# Patient Record
Sex: Male | Born: 2005 | Race: White | Hispanic: No | Marital: Single | Smoking: Never smoker
Health system: Southern US, Community
[De-identification: ages and names within clinical notes are randomized; demographics above are authoritative.]

## PROBLEM LIST (undated history)

## (undated) DIAGNOSIS — Q761 Klippel-Feil syndrome: Secondary | ICD-10-CM

## (undated) HISTORY — DX: Klippel-Feil syndrome: Q76.1

---
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
## 2023-12-24 ENCOUNTER — Ambulatory Visit (INDEPENDENT_AMBULATORY_CARE_PROVIDER_SITE_OTHER): Admitting: Internal Medicine

## 2023-12-24 ENCOUNTER — Encounter: Payer: Self-pay | Admitting: Internal Medicine

## 2023-12-24 VITALS — BP 112/60 | Ht 67.5 in | Wt 108.8 lb

## 2023-12-24 DIAGNOSIS — Q761 Klippel-Feil syndrome: Secondary | ICD-10-CM | POA: Insufficient documentation

## 2023-12-24 DIAGNOSIS — R636 Underweight: Secondary | ICD-10-CM | POA: Diagnosis not present

## 2023-12-24 DIAGNOSIS — Z681 Body mass index (BMI) 19 or less, adult: Secondary | ICD-10-CM | POA: Diagnosis not present

## 2023-12-24 NOTE — Progress Notes (Signed)
 Subjective:    Patient ID: Alejandro Stein, male    DOB: 2005-09-16, 18 y.o.   MRN: [id number]  HPI  Pt presents to the clinic today to establish care and for management of the conditions listed below.   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 He does follow with Dr. Jarold at [facility] and needs a new referral placed.   Review of Systems   No past medical history on file.  No current outpatient medications on file.   No current facility-administered medications for this visit.    No Known Allergies  No family history on file.  Social History   Socioeconomic History   Marital status: Single    Spouse name: Not on file   Number of children: Not on file   Years of education: Not on file   Highest education level: Not on file  Occupational History   Not on file  Tobacco Use   Smoking status: Never   Smokeless tobacco: Not on file  Substance and Sexual Activity   Alcohol use: No   Drug use: Not on file   Sexual activity: Not on file  Other Topics Concern   Not on file  Social History Narrative   Not on file   Social Drivers of Health   Financial Resource Strain: Not on file  Food Insecurity: Not on file  Transportation Needs: Not on file  Physical Activity: Not on file  Stress: Not on file  Social Connections: Not on file  Intimate Partner Violence: Not on file     Constitutional: Denies fever, malaise, fatigue, headache or abrupt weight changes.  HEENT: Denies eye pain, eye redness, ear pain, ringing in the ears, wax buildup, runny nose, nasal congestion, bloody nose, or sore throat. Respiratory: Denies difficulty breathing, shortness of breath, cough or sputum production.   Cardiovascular: Denies chest pain, chest tightness, palpitations or swelling in the hands or feet.  Gastrointestinal: Denies abdominal pain, bloating, constipation, diarrhea or blood in the stool.  GU: Denies urgency,  frequency, pain with urination, burning sensation, blood in urine, odor or discharge. Musculoskeletal: Pt reports nocturnal leg pain. Denies decrease in range of motion, difficulty with gait, muscle pain or joint pain and swelling.  Skin: Denies redness, rashes, lesions or ulcercations.  Neurological: Denies dizziness, difficulty with memory, difficulty with speech or problems with balance and coordination.  Psych: Denies anxiety, depression, SI/HI.  No other specific complaints in a complete review of systems (except as listed in HPI above).      Objective:   Physical Exam  BP 112/60 (BP Location: Left Arm, Patient Position: Sitting, Cuff Size: Normal)   Ht 5' 7.5 (1.715 m)   Wt 108 lb 12.8 oz (49.4 kg)   BMI 16.79 kg/m   Wt Readings from Last 3 Encounters:  08/10/22 115 lb (52.2 kg) (3%, Z= -1.95)*  04/28/15 63 lb (28.6 kg) (6%, Z= -1.58)*   * Growth percentiles are based on CDC (Boys, 2-20 Years) data.    General: Appears his stated age, underweight, in NAD. Skin: Warm, dry and intact. No rashes, lesions or ulcerations noted. HEENT: Head: normal shape and size; Eyes: sclera white, no icterus, conjunctiva pink, PERRLA and EOMs intact;  Cardiovascular: Normal rate and rhythm. S1,S2 noted.  No murmur, rubs or gallops noted.  Pulmonary/Chest: Normal effort and positive vesicular breath sounds. No respiratory distress. No wheezes, rales or ronchi noted.  Musculoskeletal: No signs of joint swelling. No  difficulty with gait.  Neurological: Alert and oriented. Coordination normal.    BMET    Component Value Date/Time   NA 139 07/28/2018 2347   K 3.5 07/28/2018 2347   CL 106 07/28/2018 2347   CO2 27 07/28/2018 2347   GLUCOSE 137 (H) 07/28/2018 2347   BUN 19 (H) 07/28/2018 2347   CREATININE 0.62 07/28/2018 2347   CALCIUM 9.3 07/28/2018 2347   GFRNONAA NOT CALCULATED 07/28/2018 2347   GFRAA NOT CALCULATED 07/28/2018 2347    Lipid Panel  No results found for: CHOL,  TRIG, HDL, CHOLHDL, VLDL, LDLCALC  CBC    Component Value Date/Time   WBC 6.0 07/28/2018 2347   RBC 4.67 07/28/2018 2347   HGB 13.2 07/28/2018 2347   HCT 38.9 07/28/2018 2347   PLT 216 07/28/2018 2347   MCV 83.3 07/28/2018 2347   MCH 28.3 07/28/2018 2347   MCHC 33.9 07/28/2018 2347   RDW 12.6 07/28/2018 2347    Hgb A1C No results found for: HGBA1C          Assessment & Plan:    RTC in 1 year for your annual exam Angeline Laura, NP

## 2023-12-24 NOTE — Assessment & Plan Note (Signed)
 Referral to [facility] orthopedics per his request

## 2023-12-24 NOTE — Assessment & Plan Note (Signed)
 Encouraged high protien, high calorie diet
